# Patient Record
Sex: Female | Born: 1965 | Race: White | Hispanic: No | Marital: Married | Smoking: Former smoker
Health system: Southern US, Community
[De-identification: ages and names within clinical notes are randomized; demographics above are authoritative.]

## PROBLEM LIST (undated history)

## (undated) DIAGNOSIS — N393 Stress incontinence (female) (male): Secondary | ICD-10-CM

## (undated) DIAGNOSIS — Z87442 Personal history of urinary calculi: Secondary | ICD-10-CM

---

## 1998-07-01 ENCOUNTER — Other Ambulatory Visit: Admission: RE | Admit: 1998-07-01 | Discharge: 1998-07-01 | Payer: Self-pay | Admitting: Obstetrics and Gynecology

## 1999-01-28 ENCOUNTER — Ambulatory Visit (HOSPITAL_COMMUNITY): Admission: RE | Admit: 1999-01-28 | Discharge: 1999-01-28 | Payer: Self-pay | Admitting: Plastic Surgery

## 1999-01-28 ENCOUNTER — Encounter (INDEPENDENT_AMBULATORY_CARE_PROVIDER_SITE_OTHER): Payer: Self-pay | Admitting: Specialist

## 1999-03-24 ENCOUNTER — Inpatient Hospital Stay (HOSPITAL_COMMUNITY): Admission: EM | Admit: 1999-03-24 | Discharge: 1999-03-26 | Payer: Self-pay | Admitting: Emergency Medicine

## 1999-09-08 ENCOUNTER — Other Ambulatory Visit: Admission: RE | Admit: 1999-09-08 | Discharge: 1999-09-08 | Payer: Self-pay | Admitting: *Deleted

## 2001-07-31 ENCOUNTER — Ambulatory Visit (HOSPITAL_COMMUNITY): Admission: RE | Admit: 2001-07-31 | Discharge: 2001-07-31 | Payer: Self-pay | Admitting: Family Medicine

## 2001-07-31 ENCOUNTER — Encounter: Payer: Self-pay | Admitting: Family Medicine

## 2002-10-02 ENCOUNTER — Emergency Department (HOSPITAL_COMMUNITY): Admission: EM | Admit: 2002-10-02 | Discharge: 2002-10-02 | Payer: Self-pay | Admitting: Emergency Medicine

## 2003-08-10 HISTORY — PX: NASAL SEPTUM SURGERY: SHX37

## 2003-10-08 ENCOUNTER — Encounter: Admission: RE | Admit: 2003-10-08 | Discharge: 2003-10-08 | Payer: Self-pay | Admitting: Family Medicine

## 2003-10-14 ENCOUNTER — Encounter: Admission: RE | Admit: 2003-10-14 | Discharge: 2003-10-14 | Payer: Self-pay | Admitting: Family Medicine

## 2003-11-07 ENCOUNTER — Ambulatory Visit (HOSPITAL_COMMUNITY): Admission: RE | Admit: 2003-11-07 | Discharge: 2003-11-07 | Payer: Self-pay | Admitting: Family Medicine

## 2004-08-09 HISTORY — PX: BREAST ENHANCEMENT SURGERY: SHX7

## 2005-04-20 ENCOUNTER — Observation Stay (HOSPITAL_COMMUNITY): Admission: AD | Admit: 2005-04-20 | Discharge: 2005-04-21 | Payer: Self-pay | Admitting: *Deleted

## 2006-09-06 IMAGING — CT CT PELVIS W/ CM
1 of 3 series · 14 of 32 positions shown, 19 images · IV contrast (CONTRAST)
Comparison: none

CLINICAL DATA: Right lower abdominal and pelvic pain.  Nausea and vomiting.  Question of appendicitis.
 ABDOMEN CT WITH CONTRAST:
TECHNIQUE: Multidetector CT imaging of the abdomen was performed following the standard protocol during bolus administration of intravenous contrast.
 Contrast:  100 cc Omnipaque 300 and oral contrast.
TECHNIQUE: Multidetector CT imaging of the pelvis was performed following the standard protocol during bolus administration of intravenous contrast.

[Series 9574: — · axial · 0.74mm/px · z∈[+1329,+1679]mm · 14 of 80 slices shown, 19 images]
[im 5/80  soft-tissue]
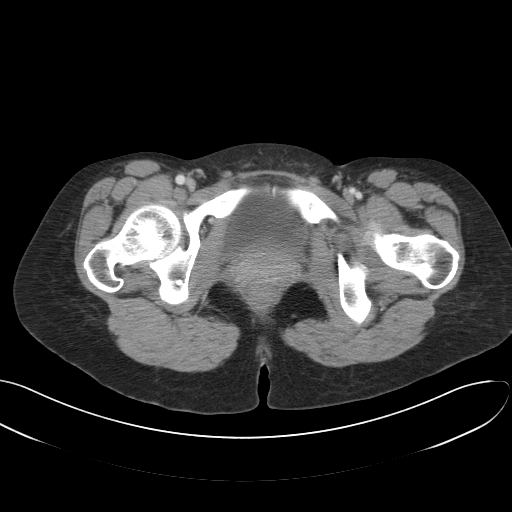
[im 5/80  bone]
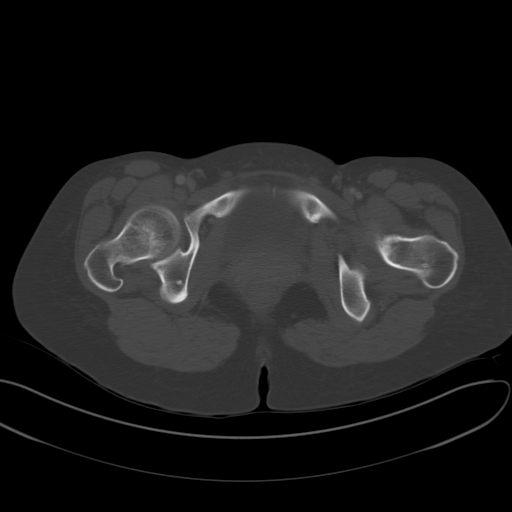
[im 10/80  soft-tissue]
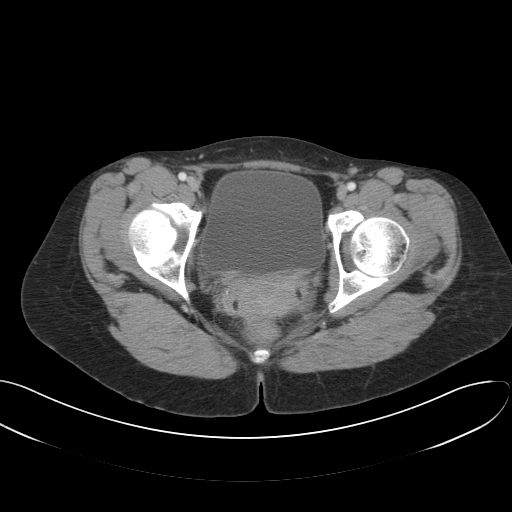
[im 19/80  soft-tissue]
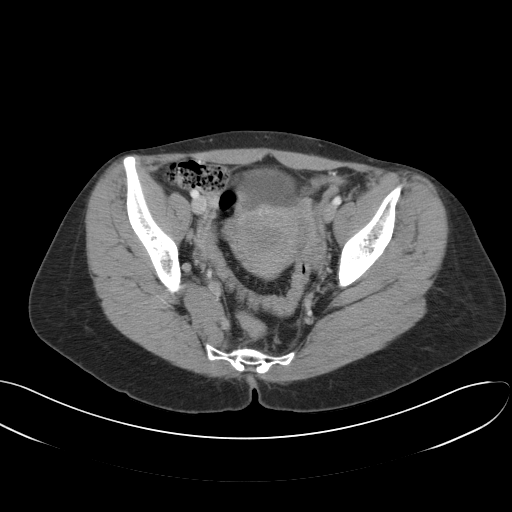
[im 24/80  soft-tissue]
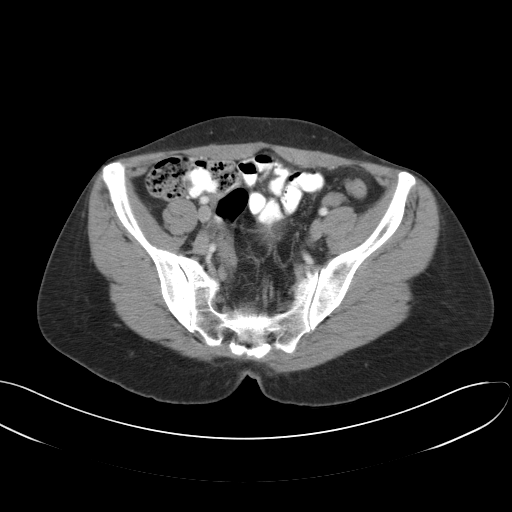
[im 28/80  soft-tissue]
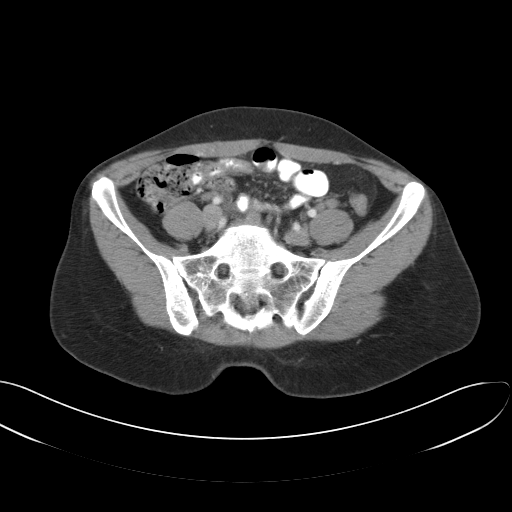
[im 33/80  soft-tissue]
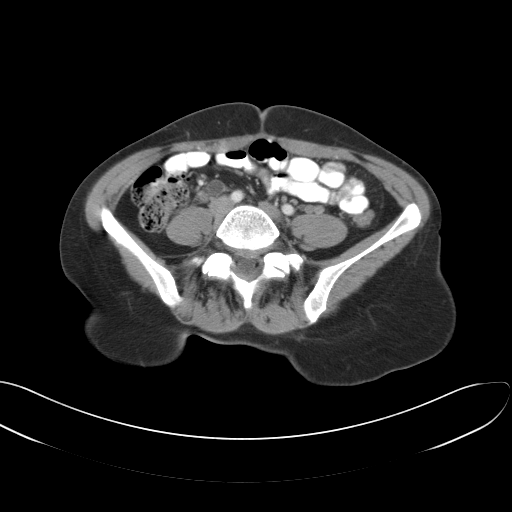
[im 42/80  soft-tissue]
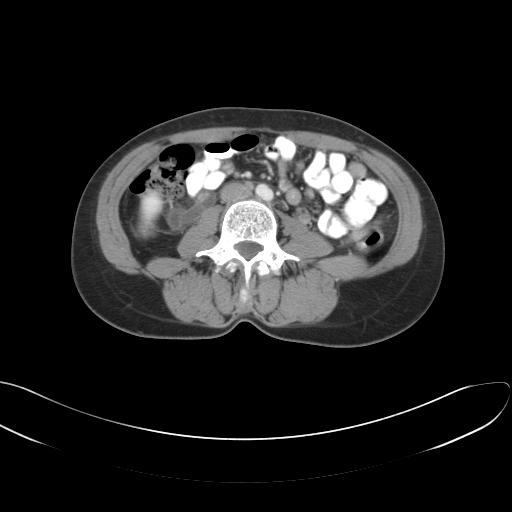
[im 47/80  soft-tissue]
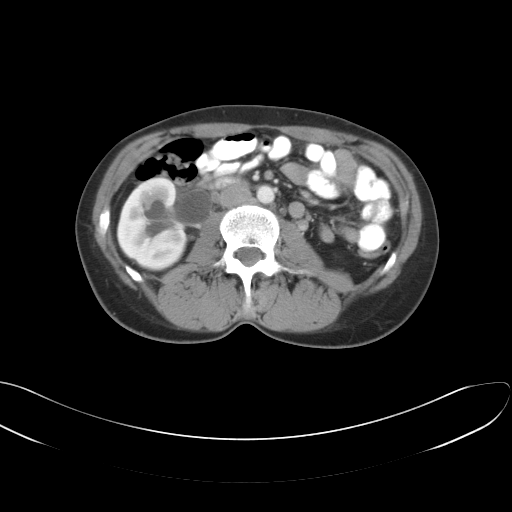
[im 52/80  soft-tissue]
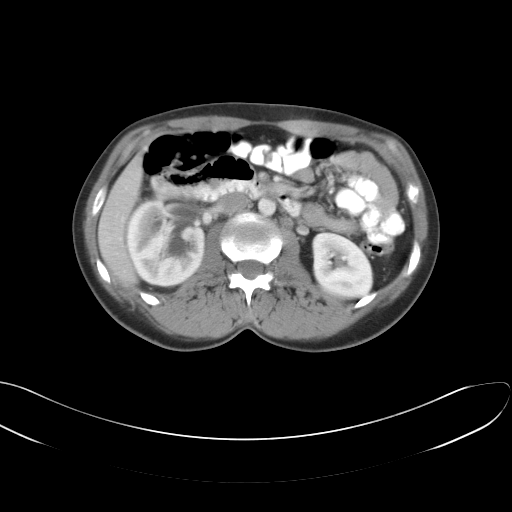
[im 52/80  bone]
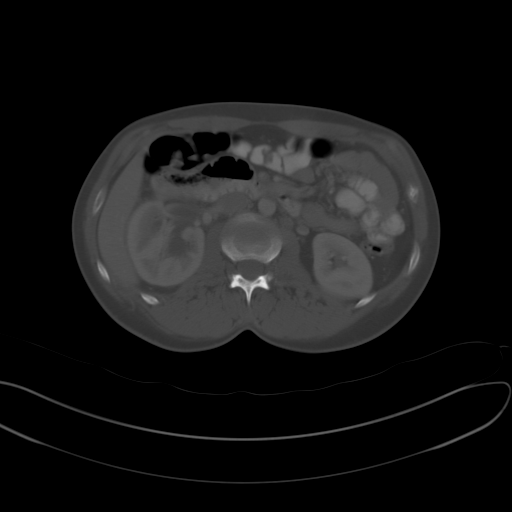
[im 56/80  soft-tissue]
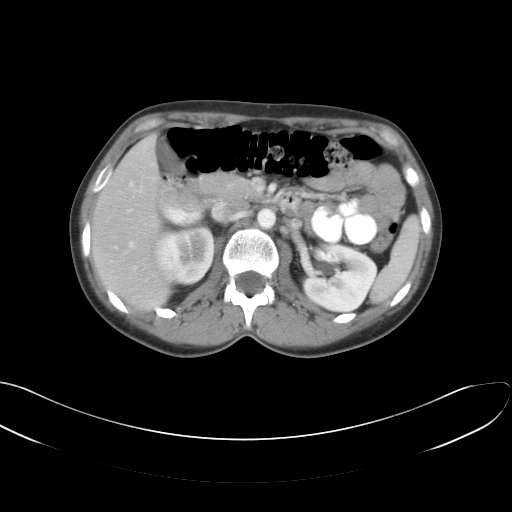
[im 61/80  soft-tissue]
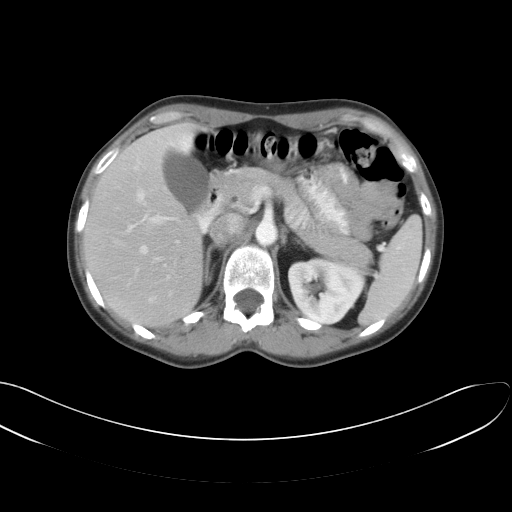
[im 61/80  lung]
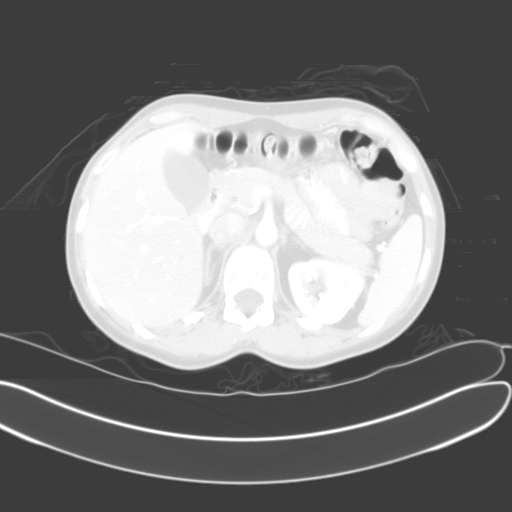
[im 66/80  lung]
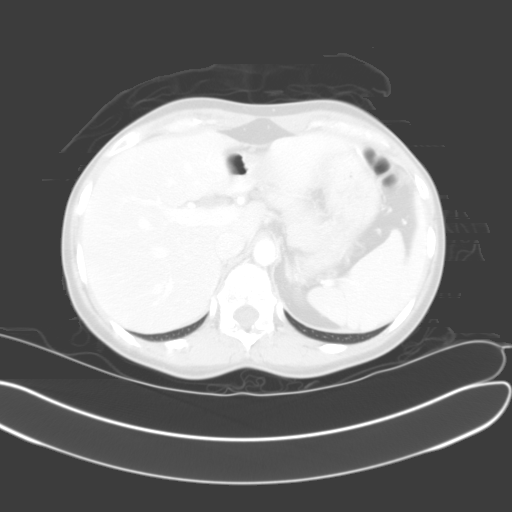
[im 70/80  soft-tissue]
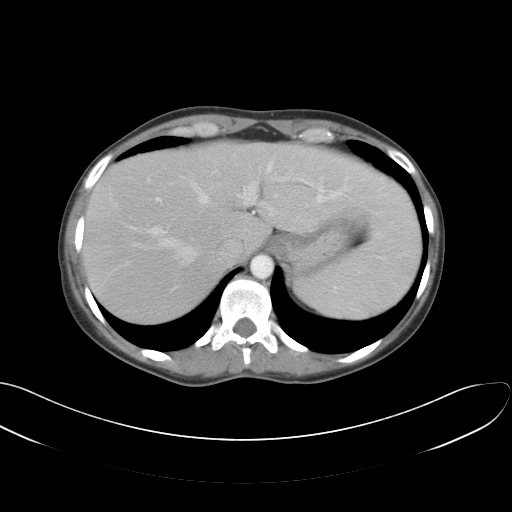
[im 70/80  lung]
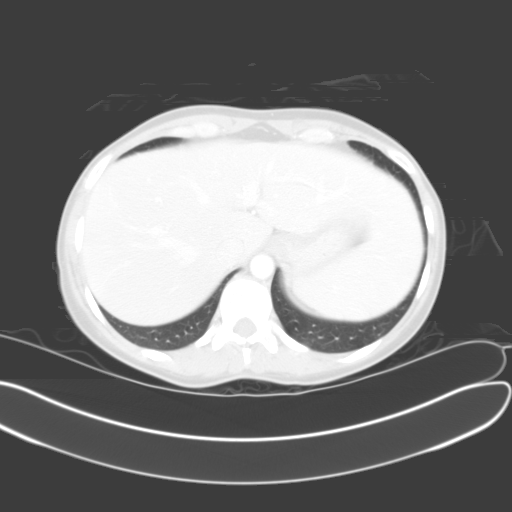
[im 75/80  soft-tissue]
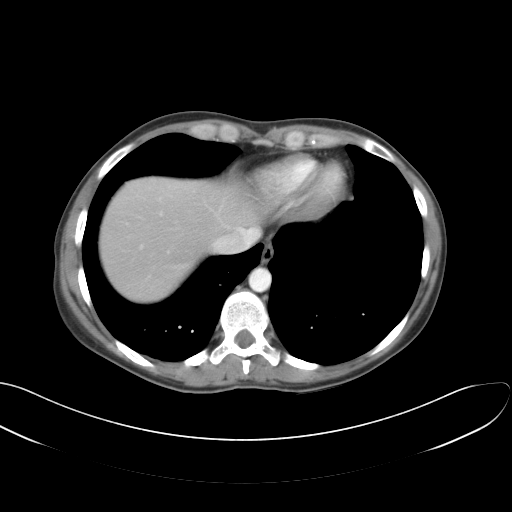
[im 75/80  lung]
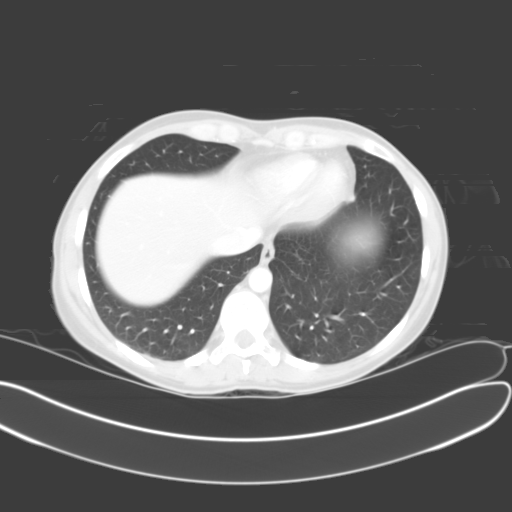

[14 of 32 positions shown; findings below may reference images not displayed]

FINDINGS: Moderate right hydronephrosis and ureterectasis is seen.  The left kidney is normal in appearance.  The other abdominal parenchymal organs are also unremarkable.  
 There is no evidence of abdominal mass or adenopathy.  There is no evidence of inflammatory process.
IMPRESSION: Moderate right hydronephrosis.  See pelvis CT report below.
 PELVIS CT WITH CONTRAST:
FINDINGS: A calculus is seen at the right ureterovesical junction which measures approximately 4 mm.  Moderate right ureterectasis is seen.  There is no evidence of pelvic mass or inflammatory process.  No abnormal fluid collections are seen.  Incidental note is made of a pessary.
IMPRESSION: 4 mm calculus at the right ureterovesical junction.  This results in moderate right hydronephrosis and ureterectasis.

## 2008-06-11 ENCOUNTER — Other Ambulatory Visit: Admission: RE | Admit: 2008-06-11 | Discharge: 2008-06-11 | Payer: Self-pay | Admitting: Unknown Physician Specialty

## 2008-06-11 ENCOUNTER — Encounter (INDEPENDENT_AMBULATORY_CARE_PROVIDER_SITE_OTHER): Payer: Self-pay | Admitting: Unknown Physician Specialty

## 2010-12-25 NOTE — Discharge Summary (Signed)
NAME:  Rita Vazquez, Rita Vazquez                ACCOUNT NO.:  [id number]   MEDICAL RECORD NO.:  [id number]          PATIENT TYPE:  OBV   LOCATION:  A417                          FACILITY:  [facility]   PHYSICIAN:  Langley Gauss, MD     DATE OF BIRTH:  04-03-1966   DATE OF ADMISSION:  04/20/2005  DATE OF DISCHARGE:  LH                                 DISCHARGE SUMMARY   DIAGNOSES:  1.  Right lower quadrant pain.  2.  Hemorrhagic ovarian cyst.  3.  Renal calculi.  4.  Nausea.   DISPOSITION:  The patient is to follow up in the office in one week's time.   DISCHARGE MEDICATIONS:  The patient was given a prescription for Lortab  during her prenatal office visit.  On today's date she was given Macrodantin  100 mg p.o. b.i.d. x7 days in effort to prevent superinfection.  She also  received 1 g of IV Rocephin during this hospital stay.   OTHER MEDICATIONS ADMINISTERED:  The patient initially was treated with IV  morphine and IV Phenergan.  She had a paradoxical reaction to the morphine  with chest pain and palpitations, subsequently was treated with 5 mg of IV  Valium with complete resolution of her symptoms.   RADIOLOGICAL STUDIES PERFORMED:  A CT with contrast was performed the late  p.m. of initial observation.  Actual time recorded for interpretation of the  CT 04/21/05 at 12:39 a.m., noted was a 4 mm stone at the right  ureterovesicular  junction with moderate right hydronephrosis.  Ovaries were  visualized with follicles identified bilaterally.   OTHER LABORATORY STUDIES:  A clean catch UA neg for leukocyte esterase,  negative for nitrites, negative for ketones, 3-6 red cells, few epithelial  cells.  Serum hCG is negative, hemoglobin 12.8, hematocrit 36.6, white count  16.8.   HOSPITAL COURSE:  See previous dictation.  The patient was seen in the  office 04/20/05 with complaints of right lower quadrant right-sided pain.  Ultrasound did reveal the presence of a hemorrhagic ovarian cyst on the  right.  The patient was sent home from the office with p.o. Lortab  prescription.  However, she represented at [facility] late p.m. of 04/20/05  complaining of increased nausea as well as increased pain.  With diagnoses  uncertain, CT of the abdomen and pelvis with contrast was performed which  did reveal the presence of the 4 mm stone at the right ureterovesical  junction.  Subsequently this has been managed expectantly, though the  patient has not identified passage of a stone.  She has had near complete  resolution and cessation of her colicky pain.  She has been up ambulatory,  has only received pain medications x1 today.  Thus she is discharged to home  at this time.  A urological  consultation would have been obtained only if pain had persisted or if the  stone was noted to be larger.  Thus the patient is discharged to home from  observation services 04/21/05.  Follow up in the office in one week's time or  as clinically indicated.  Langley Gauss, MD  Electronically Signed     DC/MEDQ  D:  04/21/2005  T:  04/22/2005  Job:  [id number]

## 2010-12-25 NOTE — H&P (Signed)
NAME:  Rita Vazquez, Rita Vazquez                ACCOUNT NO.:  [id number]   MEDICAL RECORD NO.:  [id number]          PATIENT TYPE:  OBV   LOCATION:  A417                          FACILITY:  [facility]   PHYSICIAN:  Langley Gauss, MD     DATE OF BIRTH:  1966-05-23   DATE OF ADMISSION:  04/20/2005  DATE OF DISCHARGE:  LH                                HISTORY & PHYSICAL   Patient is a white female, gravida 2, para 2, two prior vaginal deliveries.  She is currently utilizing a NuvaRing for birth control purposes.  Patient  had a last menstrual period that started 10 days ago.  This was a normal  period, other than being somewhat lighter and shorter than usual.  The  patient was placed on observation status.  Complains of right lower quadrant  pain.  The history is that yesterday a.m., the patient had some fairly  severe sharp right lower quadrant pain, which caused her to curl up in the  fetal position.  She was scheduled to be off work.  Thus, by restricting her  activity, she was able to limit the amount of pain she experienced.  She  took Tums and p.o. Tylenol, which she stated gave her fairly significant  pain relief.  Then, the patient slept poorly throughout the night.  Today,  the right lower quadrant pain has moved up higher in the right side of the  lower abdomen and is noted to radiate around to the patient's right side.  She states that the pain is improved by lying on that side and limiting her  movement.  She was seen for evaluation today in the office, at which time a  transvaginal and transabdominal ultrasound were performed.  She was noted to  have a 2.8 cm hemorrhagic cyst on the right as well as multiple smaller  follicles, making the right ovary about 7 cm in maximum diameter.  Appeared  to be some fluid adjacent to the right ovary and appeared to be viscus in  nature.  No other free fluid was identified within the pelvis.  Uterus was  noted to be normal in size, shape, and configuration  and anteflexed.  Left  ovary was noted to be atrophic with only a single follicle.  Patient's  review of systems was pertinent for good appetite today.  The patient had  eaten prior to the office visit.  She had a bowel movement today.  She  frequently has 2-3 bowel movements per day and has noted no change in her  bowel habits.  She denies any diarrhea.  She denies any fever or chills.  She denies any blood in the urine or any urinary tract infection type of  symptoms.   Patient states that she previously had an allergic reaction to [facility]  FORTIS.   CURRENT MEDICATIONS:  Include Tylenol and the NuvaRing for birth control  purposes.  I did prescribe her Lortab 10/500 today.  She took one of these  and stated this gave her no relief.   She has a prior vaginal deliveries of an 43 year old and  now an 45-year-old.  The second delivery was complicated by post-partum hemorrhage with retained  placenta, requiring a D&C.  No transfusion is indicated.  The patient has  bilateral breast implants.  She has also had tonsils removed at about age  45.   PHYSICAL EXAMINATION:  VITAL SIGNS:  Temperature 97.9.  GENERAL:  The patient is seen in the office today.  She did have increased  pain post examination.  Upon presentation to the hospital with increased  pain this p.m., she is noted to be pale in appearance and complaining of  increased pain and now significant nausea.  HEENT:  Negative.  NECK:  Supple.  No palpable adenopathy.  LUNGS:  Clear.  CARDIOVASCULAR:  Regular rate and rhythm.  ABDOMEN:  Soft.  There is noted to be some tenderness to palpation in the  right lower quadrant but more marked tenderness in the right mid quadrant  above wherever we would expect the ovary to be.  Manipulation of the uterus  is now mildly tender only.  The left adnexa is palpably normal and  nontender.   LABORATORY STUDIES:  Currently pending, consisting of an ABG, CBC with diff,  BUN and creatinine.    ASSESSMENT/PLAN:  Patient with previous diagnosis and impression of  hemorrhagic right ovarian cyst; however, now rather than the pain  decreasing, as it had over the past 24 hours, the patient has had an  increased intensity of the pain as well as onset of significant nausea.  The  nausea was noted to improve after vomiting.  The differential diagnosis  includes possible appendicitis.  This p.m., the patient will be treated with  IV analgesics as well as Zofran for nausea.  Subsequently, CT with contrast  has been requested.  The patient's mother, Theone Stanley, requests that Dr. Katrinka Blazing  be consulted if non-GYN process is identified.      Langley Gauss, MD  Electronically Signed     DC/MEDQ  D:  04/20/2005  T:  04/20/2005  Job:  [id number]

## 2011-01-22 ENCOUNTER — Other Ambulatory Visit: Payer: Self-pay | Admitting: Family Medicine

## 2011-01-22 DIAGNOSIS — Z1231 Encounter for screening mammogram for malignant neoplasm of breast: Secondary | ICD-10-CM

## 2011-02-02 ENCOUNTER — Ambulatory Visit
Admission: RE | Admit: 2011-02-02 | Discharge: 2011-02-02 | Disposition: A | Discharge status: Home / Self Care | Payer: BC Managed Care – PPO | Source: Ambulatory Visit | Attending: Family Medicine | Admitting: Family Medicine

## 2011-02-02 DIAGNOSIS — Z1231 Encounter for screening mammogram for malignant neoplasm of breast: Secondary | ICD-10-CM

## 2011-02-04 ENCOUNTER — Other Ambulatory Visit: Payer: Self-pay | Admitting: Family Medicine

## 2011-02-04 DIAGNOSIS — R928 Other abnormal and inconclusive findings on diagnostic imaging of breast: Secondary | ICD-10-CM

## 2011-02-05 ENCOUNTER — Ambulatory Visit
Admission: RE | Admit: 2011-02-05 | Discharge: 2011-02-05 | Disposition: A | Discharge status: Home / Self Care | Payer: BC Managed Care – PPO | Source: Ambulatory Visit | Attending: Family Medicine | Admitting: Family Medicine

## 2011-02-05 DIAGNOSIS — R928 Other abnormal and inconclusive findings on diagnostic imaging of breast: Secondary | ICD-10-CM

## 2011-02-09 ENCOUNTER — Other Ambulatory Visit: Payer: BC Managed Care – PPO

## 2011-09-29 ENCOUNTER — Encounter (HOSPITAL_COMMUNITY): Payer: Self-pay | Admitting: Psychology

## 2011-09-29 ENCOUNTER — Ambulatory Visit (INDEPENDENT_AMBULATORY_CARE_PROVIDER_SITE_OTHER): Payer: BC Managed Care – PPO | Admitting: Psychology

## 2011-09-29 DIAGNOSIS — F4322 Adjustment disorder with anxiety: Secondary | ICD-10-CM

## 2011-09-29 NOTE — Progress Notes (Signed)
Patient:   Rita D Kimes   DOB:   March 12, 1966  MR Number:  [id number]  Location:  [facility] [address] [place] [address] Dept: [phone]           Date of Service:   09/29/2011  Start Time:   1 PM End Time:   2 PM  Provider/Observer:  Hershal Coria PSYD       Billing Code/Service: [phone]  Chief Complaint:     Chief Complaint  Patient presents with  . Family Problem  . Anxiety    Reason for Service:  The patient was self-referred because of concerns and issues she is having with her stepchildren. The patient was married in September, which is her second marriage. Her new husband has children from his previous marriage as well and the integration of this marital unit has had some difficulties. The youngest son of her husband lives with them and has significant interpersonal limitations and has displayed symptoms that sound similar to Asperger's as well as showing some symptoms of Tourette's. The patient is bit uncomfortable with some of the things that happened has not known how to address these things her adjust to them. This is causing stress for the patient and she is trying to figure out how to handle them so they don't magnifying marital distress.  Current Status:  The patient has had difficulty adjusting to some of the traits and behaviors that her stepson and engages in including regularly climbing into her bed to lay with his father in the evenings. This stepson is 31 years old and has not left the house and does not take care of his daily cleanliness and has not been able to have a job outside of helping his father and his father's business. These daughter sees her creating a lot of stress and the patient is really not known how to handle them are dressed them.  Reliability of Information: The patient provided all this information or self.  Behavioral Observation: Rita Vazquez   presents as a 46 y.o.-year-old Right Caucasian Female who appeared her stated age. her dress was Appropriate and she was Well Groomed and her manners were Appropriate to the situation.  There were not any physical disabilities noted.  she displayed an appropriate level of cooperation and motivation.    Interactions:    Active   Attention:   within normal limits  Memory:   within normal limits  Visuo-spatial:   within normal limits  Speech (Volume):  normal  Speech:   normal pitch and normal volume  Thought Process:  Coherent  Though Content:  WNL  Orientation:   person, place, time/date, situation, day of week and month of year  Judgment:   Good  Planning:   Good  Affect:    Appropriate  Mood:    Anxious  Insight:   Good  Intelligence:   high  Marital Status/Living: The patient has recently entered into her second marriage. She currently lives with her husband, her son, and her stepson. This is her third marriage. She has a 48 year old daughter and a 53 year old son and a 3 year old son continues to live with them. She was born in [place] and grew up in [place] the marriage is going well and with the exception of trying to figure out how the relationship between her husband and his son has developed and continues there are no other big issues that are  there have been in their marriage.  Current Employment: The patient is currently working and has been employed for the past 1-1/2 years at a [employer], clinical. 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    Education:   Graduate  Medical History:  History reviewed. No pertinent past medical history.      No outpatient encounter prescriptions on file as of 09/29/2011.          Sexual History:   History  Sexual  Activity  . Sexually Active: Yes    Abuse/Trauma History: No history of abuse or trauma  Psychiatric History:  No history of psychiatric issues her treatment  Family Med/Psych History:  Family History  Problem Relation Age of Onset  . Depression Brother     Risk of Suicide/Violence: virtually non-existent   Impression/DX:  At this point, there do appear to be some specific stressors that are developed with her new marriage. She was married this past September and the relationship an interaction between she and her husband is going quite well. The patient's husband has 3 children, all of whom have their own issues including some significant social and interpersonal issues. The youngest son continues to live with them and by her description display symptoms that are consistent with Asperger's as well as symptoms consistent with Tourette's syndrome. However, her husband does not really addressed these and continues to get in situations where the kids essentially manipulate him into doing whatever they want. Some of the behaviors that her stepson is doing caused her great concern and feelings of being uncomfortable. One big one is the fact that her stepson will constantly crawl into her bed when she is not day or to lay with his father. This makes her feel very uncomfortable and not sure about the dynamics of this and the fact that the stepson is 44 years old really does not no other handle this.  Disposition/Plan:  We will begin working on psychotherapeutic interventions to help the patient adjust to these difficulties and adjustment problems around her current life situation and anxiety associated with it.  Diagnosis:    Axis I:   1. Adjustment disorder with anxious mood         Axis II: No diagnosis       Axis III:  No significant medical issues other than those that can be found in her chart       Axis IV:  other psychosocial or environmental problems          Axis V:  51-60 moderate  symptoms

## 2011-12-07 ENCOUNTER — Other Ambulatory Visit: Payer: Self-pay | Admitting: Dermatology

## 2012-07-21 ENCOUNTER — Other Ambulatory Visit: Payer: Self-pay

## 2012-07-24 ENCOUNTER — Encounter (HOSPITAL_BASED_OUTPATIENT_CLINIC_OR_DEPARTMENT_OTHER): Payer: Self-pay | Admitting: *Deleted

## 2012-07-24 NOTE — Progress Notes (Signed)
NPO AFTER MN WITH EXCEPTION CLEAR LIQUIDS UNTIL 0830 (NO CREAM/ MILK PRODUCTS). ARRIVES  AT 1230. PRE-OP ORDERS PENDING. NEEDS HG AND URINE PREG.

## 2012-07-25 ENCOUNTER — Other Ambulatory Visit: Payer: Self-pay | Admitting: Obstetrics and Gynecology

## 2012-07-27 ENCOUNTER — Encounter (HOSPITAL_BASED_OUTPATIENT_CLINIC_OR_DEPARTMENT_OTHER): Payer: Self-pay | Admitting: *Deleted

## 2012-07-27 ENCOUNTER — Ambulatory Visit (HOSPITAL_BASED_OUTPATIENT_CLINIC_OR_DEPARTMENT_OTHER)
Admission: RE | Admit: 2012-07-27 | Discharge: 2012-07-27 | Disposition: A | Discharge status: Home / Self Care | Payer: BC Managed Care – PPO | Source: Ambulatory Visit | Attending: Obstetrics and Gynecology | Admitting: Obstetrics and Gynecology

## 2012-07-27 ENCOUNTER — Ambulatory Visit (HOSPITAL_BASED_OUTPATIENT_CLINIC_OR_DEPARTMENT_OTHER): Payer: BC Managed Care – PPO | Admitting: Anesthesiology

## 2012-07-27 ENCOUNTER — Encounter (HOSPITAL_BASED_OUTPATIENT_CLINIC_OR_DEPARTMENT_OTHER)
Admission: RE | Disposition: A | Discharge status: Home / Self Care | Payer: Self-pay | Source: Ambulatory Visit | Attending: Obstetrics and Gynecology

## 2012-07-27 ENCOUNTER — Encounter (HOSPITAL_BASED_OUTPATIENT_CLINIC_OR_DEPARTMENT_OTHER): Payer: Self-pay | Admitting: Anesthesiology

## 2012-07-27 DIAGNOSIS — IMO0002 Reserved for concepts with insufficient information to code with codable children: Secondary | ICD-10-CM | POA: Insufficient documentation

## 2012-07-27 DIAGNOSIS — N9089 Other specified noninflammatory disorders of vulva and perineum: Secondary | ICD-10-CM | POA: Insufficient documentation

## 2012-07-27 DIAGNOSIS — R32 Unspecified urinary incontinence: Secondary | ICD-10-CM

## 2012-07-27 DIAGNOSIS — N816 Rectocele: Secondary | ICD-10-CM | POA: Insufficient documentation

## 2012-07-27 DIAGNOSIS — N8111 Cystocele, midline: Secondary | ICD-10-CM | POA: Insufficient documentation

## 2012-07-27 HISTORY — DX: Stress incontinence (female) (male): N39.3

## 2012-07-27 HISTORY — PX: LABIOPLASTY: SHX1900

## 2012-07-27 HISTORY — PX: CYSTOCELE REPAIR: SHX163

## 2012-07-27 HISTORY — DX: Personal history of urinary calculi: Z87.442

## 2012-07-27 LAB — CBC
HCT: 36.7 % (ref 36.0–46.0)
MCH: 31.4 pg (ref 26.0–34.0)
MCV: 88.6 fL (ref 78.0–100.0)
RBC: 4.14 MIL/uL (ref 3.87–5.11)
WBC: 5.2 10*3/uL (ref 4.0–10.5)

## 2012-07-27 LAB — HCG, SERUM, QUALITATIVE: Preg, Serum: NEGATIVE

## 2012-07-27 SURGERY — LABIAPLASTY, VULVA
Anesthesia: General | Site: Vagina | Wound class: Clean Contaminated

## 2012-07-27 MED ORDER — PROMETHAZINE HCL 25 MG/ML IJ SOLN
6.2500 mg | INTRAMUSCULAR | Status: DC | PRN
  Filled 2012-07-27: qty 1

## 2012-07-27 MED ORDER — CEFAZOLIN SODIUM-DEXTROSE 2-3 GM-% IV SOLR
2.0000 g | INTRAVENOUS | Status: DC
  Filled 2012-07-27: qty 50

## 2012-07-27 MED ORDER — FENTANYL CITRATE 0.05 MG/ML IJ SOLN
INTRAMUSCULAR | Status: DC | PRN
  Administered 2012-07-27 (×2): 25 ug via INTRAVENOUS
  Administered 2012-07-27: 50 ug via INTRAVENOUS
  Administered 2012-07-27 (×6): 25 ug via INTRAVENOUS
  Administered 2012-07-27: 50 ug via INTRAVENOUS

## 2012-07-27 MED ORDER — KETOROLAC TROMETHAMINE 30 MG/ML IJ SOLN
15.0000 mg | Freq: Once | INTRAMUSCULAR | Status: DC | PRN
  Filled 2012-07-27: qty 1

## 2012-07-27 MED ORDER — LIDOCAINE HCL (CARDIAC) 20 MG/ML IV SOLN
INTRAVENOUS | Status: DC | PRN
  Administered 2012-07-27: 75 mg via INTRAVENOUS

## 2012-07-27 MED ORDER — LACTATED RINGERS IV SOLN
INTRAVENOUS | Status: DC | PRN
  Administered 2012-07-27 (×2): via INTRAVENOUS

## 2012-07-27 MED ORDER — KETOROLAC TROMETHAMINE 30 MG/ML IJ SOLN
INTRAMUSCULAR | Status: DC | PRN
  Administered 2012-07-27: 30 mg via INTRAVENOUS

## 2012-07-27 MED ORDER — MIDAZOLAM HCL 5 MG/5ML IJ SOLN
INTRAMUSCULAR | Status: DC | PRN
  Administered 2012-07-27 (×2): 1 mg via INTRAVENOUS

## 2012-07-27 MED ORDER — DEXAMETHASONE SODIUM PHOSPHATE 4 MG/ML IJ SOLN
INTRAMUSCULAR | Status: DC | PRN
  Administered 2012-07-27: 10 mg via INTRAVENOUS

## 2012-07-27 MED ORDER — FENTANYL CITRATE 0.05 MG/ML IJ SOLN
25.0000 ug | INTRAMUSCULAR | Status: DC | PRN
  Filled 2012-07-27: qty 1

## 2012-07-27 MED ORDER — PROPOFOL 10 MG/ML IV BOLUS
INTRAVENOUS | Status: DC | PRN
  Administered 2012-07-27: 200 mg via INTRAVENOUS

## 2012-07-27 MED ORDER — LACTATED RINGERS IV SOLN
INTRAVENOUS | Status: DC
  Administered 2012-07-27: 14:00:00 via INTRAVENOUS
  Filled 2012-07-27: qty 1000

## 2012-07-27 MED ORDER — OXYCODONE-ACETAMINOPHEN 5-325 MG PO TABS: 1.0000 | ORAL_TABLET | ORAL | Status: AC | PRN

## 2012-07-27 MED ORDER — ONDANSETRON HCL 4 MG/2ML IJ SOLN
INTRAMUSCULAR | Status: DC | PRN
  Administered 2012-07-27: 4 mg via INTRAVENOUS

## 2012-07-27 MED ORDER — LIDOCAINE-EPINEPHRINE (PF) 1 %-1:200000 IJ SOLN: INTRAMUSCULAR | Status: DC | PRN

## 2012-07-27 MED ORDER — BUPIVACAINE-EPINEPHRINE 0.25% -1:200000 IJ SOLN
INTRAMUSCULAR | Status: DC | PRN
  Administered 2012-07-27: 12 mL

## 2012-07-27 MED ORDER — TRAMADOL HCL 50 MG PO TABS: 50.0000 mg | ORAL_TABLET | Freq: Four times a day (QID) | ORAL | Status: AC | PRN

## 2012-07-27 SURGICAL SUPPLY — 51 items
BAG URINE DRAINAGE (UROLOGICAL SUPPLIES) ×2 IMPLANT
BAG URINE LEG 19OZ MD ST LTX (BAG) ×2 IMPLANT
BLADE SURG 15 STRL LF DISP TIS (BLADE) ×1 IMPLANT
BLADE SURG 15 STRL SS (BLADE) ×1
CATH FOLEY 2WAY SLVR  5CC 14FR (CATHETERS) ×1
CATH FOLEY 2WAY SLVR 5CC 14FR (CATHETERS) ×1 IMPLANT
CATH ROBINSON RED A/P 16FR (CATHETERS) IMPLANT
CLOTH BEACON ORANGE TIMEOUT ST (SAFETY) ×2 IMPLANT
COVER TABLE BACK 60X90 (DRAPES) ×2 IMPLANT
DECANTER SPIKE VIAL GLASS SM (MISCELLANEOUS) IMPLANT
DRAPE LG THREE QUARTER DISP (DRAPES) ×4 IMPLANT
DRAPE UNDERBUTTOCKS STRL (DRAPE) ×2 IMPLANT
ELECT NEEDLE TIP 2.8 STRL (NEEDLE) IMPLANT
ELECT REM PT RETURN 9FT ADLT (ELECTROSURGICAL) ×2
ELECTRODE REM PT RTRN 9FT ADLT (ELECTROSURGICAL) ×1 IMPLANT
GLOVE BIO SURGEON STRL SZ7.5 (GLOVE) ×4 IMPLANT
GLOVE BIOGEL PI IND STRL 6.5 (GLOVE) ×2 IMPLANT
GLOVE BIOGEL PI IND STRL 7.0 (GLOVE) ×1 IMPLANT
GLOVE BIOGEL PI INDICATOR 6.5 (GLOVE) ×2
GLOVE BIOGEL PI INDICATOR 7.0 (GLOVE) ×1
GLOVE SKINSENSE NS SZ7.0 (GLOVE) ×1
GLOVE SKINSENSE NS SZ7.5 (GLOVE) ×2
GLOVE SKINSENSE STRL SZ7.0 (GLOVE) ×1 IMPLANT
GLOVE SKINSENSE STRL SZ7.5 (GLOVE) ×2 IMPLANT
GOWN PREVENTION PLUS XLARGE (GOWN DISPOSABLE) IMPLANT
GOWN STRL NON-REIN LRG LVL3 (GOWN DISPOSABLE) ×2 IMPLANT
GOWN STRL REIN XL XLG (GOWN DISPOSABLE) ×2 IMPLANT
LEGGING LITHOTOMY PAIR STRL (DRAPES) ×2 IMPLANT
NEEDLE HYPO 22GX1.5 SAFETY (NEEDLE) IMPLANT
NEEDLE SPNL 22GX3.5 QUINCKE BK (NEEDLE) IMPLANT
NS IRRIG 1000ML POUR BTL (IV SOLUTION) ×2 IMPLANT
NS IRRIG 500ML POUR BTL (IV SOLUTION) ×2 IMPLANT
PACK BASIN DAY SURGERY FS (CUSTOM PROCEDURE TRAY) ×2 IMPLANT
PAD OB MATERNITY 4.3X12.25 (PERSONAL CARE ITEMS) ×2 IMPLANT
PAD PREP 24X48 CUFFED NSTRL (MISCELLANEOUS) ×2 IMPLANT
PENCIL BUTTON BLDE SNGL 10FT (ELECTRODE) ×2 IMPLANT
SPONGE LAP 4X18 X RAY DECT (DISPOSABLE) ×2 IMPLANT
SUT MON AB 2-0 CT2 27 (SUTURE) ×4 IMPLANT
SUT MON AB-0 CT1 36 (SUTURE) ×2 IMPLANT
SUT VICRYL 3 0 CT 3 (SUTURE) IMPLANT
SYR CONTROL 10ML LL (SYRINGE) ×2 IMPLANT
SYRINGE 10CC LL (SYRINGE) ×2 IMPLANT
TOWEL NATURAL 6PK STERILE (DISPOSABLE) ×4 IMPLANT
TOWEL OR 17X24 6PK STRL BLUE (TOWEL DISPOSABLE) ×4 IMPLANT
TRAY DSU PREP LF (CUSTOM PROCEDURE TRAY) ×2 IMPLANT
TUBE CONNECTING 12X1/4 (SUCTIONS) ×2 IMPLANT
VICRYL RAPIDE ×4 IMPLANT
VICRYL RAPIDE 3-0 ×12 IMPLANT
WATER STERILE IRR 1000ML POUR (IV SOLUTION) ×2 IMPLANT
WATER STERILE IRR 500ML POUR (IV SOLUTION) ×2 IMPLANT
YANKAUER SUCT BULB TIP NO VENT (SUCTIONS) IMPLANT

## 2012-07-27 NOTE — Progress Notes (Signed)
Patient ID: Rita Vazquez, female   DOB: 04-03-1966, 46 y.o.   MRN: [id number] Patient seen and examined. Consent witnessed and signed. No changes noted. Update completed.

## 2012-07-27 NOTE — Anesthesia Preprocedure Evaluation (Addendum)
Anesthesia Evaluation  Patient identified by MRN, date of birth, ID band Patient awake    Reviewed: Allergy & Precautions, H&P , NPO status , Patient's Chart, lab work & pertinent test results  Airway Mallampati: II TM Distance: >3 FB Neck ROM: Full    Dental No notable dental hx. (+) Dental Advisory Given and Teeth Intact   Pulmonary neg pulmonary ROS,  breath sounds clear to auscultation  Pulmonary exam normal       Cardiovascular negative cardio ROS  Rhythm:Regular Rate:Normal     Neuro/Psych negative neurological ROS  negative psych ROS   GI/Hepatic negative GI ROS, Neg liver ROS,   Endo/Other  negative endocrine ROS  Renal/GU negative Renal ROS  negative genitourinary   Musculoskeletal negative musculoskeletal ROS (+)   Abdominal   Peds negative pediatric ROS (+)  Hematology negative hematology ROS (+)   Anesthesia Other Findings   Reproductive/Obstetrics negative OB ROS                          Anesthesia Physical Anesthesia Plan  ASA: I  Anesthesia Plan: General   Post-op Pain Management:    Induction: Intravenous  Airway Management Planned: LMA  Additional Equipment:   Intra-op Plan:   Post-operative Plan: Extubation in OR  Informed Consent: I have reviewed the patients History and Physical, chart, labs and discussed the procedure including the risks, benefits and alternatives for the proposed anesthesia with the patient or authorized representative who has indicated his/her understanding and acceptance.   Dental advisory given  Plan Discussed with: CRNA and Surgeon  Anesthesia Plan Comments:        Anesthesia Quick Evaluation

## 2012-07-27 NOTE — Anesthesia Postprocedure Evaluation (Signed)
Anesthesia Post Note  Patient: Rita Vazquez  Procedure(s) Performed: Procedure(s) (LRB): LABIAPLASTY (N/A) ANTERIOR REPAIR (CYSTOCELE) (N/A)  Anesthesia type: General  Patient location: PACU  Post pain: Pain level controlled  Post assessment: Post-op Vital signs reviewed  Last Vitals: BP 126/83  Pulse 92  Temp 36.4 C (Oral)  Resp 12  Ht 5' 5.5" (1.664 m)  Wt 120 lb (54.432 kg)  BMI 19.67 kg/m2  SpO2 99%  LMP 05/27/2012  Post vital signs: Reviewed  Level of consciousness: sedated  Complications: No apparent anesthesia complications

## 2012-07-27 NOTE — H&P (Signed)
NAME:  Rita Vazquez, [place]                ACCOUNT NO.:  [id number]  MEDICAL RECORD NO.:  [id number]  LOCATION:                               FACILITY:  [facility]  PHYSICIAN:  Lenoard Aden, M.D.DATE OF BIRTH:  1966/03/10  DATE OF ADMISSION:  07/27/2012 DATE OF DISCHARGE:                             HISTORY & PHYSICAL   CHIEF COMPLAINT:  Symptomatic cystocele, dyspareunia, perineal pain.  HISTORY OF PRESENT ILLNESS:  She is a 46 year old white female, G2, P2, Introital dyspareunia due to vaginal vulvar scarring post vaginal delivery.  ALLERGIES:  She has allergic to latex.  FAMILY HISTORY:  Melanoma, heart disease, myocardial infarction, hypertension, nonsmoker, nondrinker.  She denies domestic or physical violence.  She has a family history of melanoma, heart disease, and hypertension.  MEDICATIONS:  Adderall.  PHYSICAL EXAMINATION:  GENERAL:  She is a well-developed, well- nourished, white female, in no acute distress. VITAL SIGNS:  Height is 64 inches, weight of 110 pounds. HEENT:  Normal. NECK:  Supple.  Full range of motion. LUNGS:  Clear. HEART:  Regular rate and rhythm. ABDOMEN:  Soft, nontender. PELVIC:  Normal size uterus.  No adnexal masses.  Grade 1-2 cystocele. Evidence of vulvar scarring as noted and perineal relaxation.  IMPRESSION: 1. Symptomatic pelvic relaxation and stress urinary incontinence. 2. The patient declines mesh placement. 3. Vulvar scarring secondary to vaginal delivery.  PLAN:  Proceed with labia plasty, cystocele repair with possible Kelly plication, and perineorrhaphy.  Risks of anesthesia, infection, bleeding, injury to abdominal organs, and need for repair was discussed. Delayed versus immediate complications include possible bowel and bladder injury noted. The risks of Kelly plication is the primary incontinence procedure noted as it relates to the efficacy of a DVT.  The patient acknowledges these risks and wishes to  proceed.     Lenoard Aden, M.D.     RJT/MEDQ  D:  07/26/2012  T:  07/26/2012  Job:  [id number]

## 2012-07-27 NOTE — Progress Notes (Signed)
Patient ID: [place] D Gangemi, female   DOB: 26-Nov-1965, 46 y.o.   MRN: [id number] 07/27/2012  4:04 PM  PATIENT:  [place] D Laker  46 y.o. female  PRE-OPERATIVE DIAGNOSIS:  Labial scarring, Partial labial fusion, Stress Incontinence  POST-OPERATIVE DIAGNOSIS:  Labial scarring, Partial labial fusion, Stress Incontinence  PROCEDURE:  Procedure(s): LABIAPLASTY ANTERIOR REPAIR (CYSTOCELE)  SURGEON:  Surgeon(s): Lenoard Aden, MD  ASSISTANTS: none   ANESTHESIA:   local and general  ESTIMATED BLOOD LOSS: 50cc  DRAINS: none   LOCAL MEDICATIONS USED:  MARCAINE     SPECIMEN:  No Specimen  DISPOSITION OF SPECIMEN:  PATHOLOGY  COUNTS:  YES  DICTATION #: [id number]  PLAN OF CARE: DC home  PATIENT DISPOSITION:  PACU - hemodynamically stable.

## 2012-07-27 NOTE — Transfer of Care (Signed)
Immediate Anesthesia Transfer of Care Note  Patient: Rita Vazquez  Procedure(s) Performed: Procedure(s) (LRB): LABIAPLASTY (N/A) ANTERIOR REPAIR (CYSTOCELE) (N/A)  Patient Location: Patient transported to PACU with oxygen via face mask at 4 Liters / Min  Anesthesia Type: General  Level of Consciousness: awake and alert   Airway & Oxygen Therapy: Patient Spontanous Breathing and Patient connected to face mask oxygen  Post-op Assessment: Report given to PACU RN and Post -op Vital signs reviewed and stable  Post vital signs: Reviewed and stable  Dentition: Teeth and oropharynx remain in pre-op condition  Complications: No apparent anesthesia complications

## 2012-07-27 NOTE — Anesthesia Procedure Notes (Signed)
Procedure Name: LMA Insertion Date/Time: 07/27/2012 2:45 PM Performed by: Fran Lowes Pre-anesthesia Checklist: Patient identified, Emergency Drugs available, Suction available and Patient being monitored Patient Re-evaluated:Patient Re-evaluated prior to inductionOxygen Delivery Method: Circle System Utilized Preoxygenation: Pre-oxygenation with 100% oxygen Intubation Type: IV induction Ventilation: Mask ventilation without difficulty LMA: LMA inserted LMA Size: 4.0 Number of attempts: 1 Airway Equipment and Method: bite block Placement Confirmation: positive ETCO2 Tube secured with: Tape Dental Injury: Teeth and Oropharynx as per pre-operative assessment

## 2012-07-28 NOTE — Op Note (Signed)
NAMEElita Vazquez NO.:  [id number]  MEDICAL RECORD NO.:  [id number]  LOCATION:                                 FACILITY:  PHYSICIAN:  Lenoard Aden, M.D.     DATE OF BIRTH:  DATE OF PROCEDURE: DATE OF DISCHARGE:                              OPERATIVE REPORT   DESCRIPTION OF PROCEDURE:  After being apprised of risks of anesthesia, infection, bleeding, injury to abdominal organs, need for repair, delayed versus immediate complications include bowel and bladder injury, possible need for repair, the patient was brought to the operating room where she was administered general anesthetic without complications. She was prepped and draped in sterile fashion.  Foley catheter placed. After achieving adequate anesthesia, dilute Marcaine solution placed in the anterior vaginal mucosa.  The cystocele was identified.  A vertical incision was made.  The pubovesicocervical fascia was dissected sharply off the anterior vaginal mucosa and the cystocele was reduced.  A Kelly plication stitch was then placed using a 2-0 Monocryl suture and the cystocele was reduced and plicated using a 0 Monocryl suture.  The tissue was then trimmed and the vagina was closed using a 3-0 Vicryl repeat in an interrupted fashion.  Along the perineal area, the pelvic relaxation and perineal relaxation rectocele was noted.  The triangular wedge shaped tissue was removed from the perineum.  Rectocele was undermined.  Rectovaginal mucosa is dissected sharply off the posterior vaginal mucosa and the perirectal fascia was identified.  Imbricating sutures of 0 Monocryl suture were placed.  Excess tissue was trimmed. The rectocele was then closed and the perineorrhaphy was performed using combination of 2-0 Monocryl and 3-0 Vicryl repeat sutures in a standard fashion.  Rectal exam reveals no evidence of sutures in the rectum. Good hemostasis was noted.  Attention was then turned to the  labioplasty whereby the labia marked bilaterally where the fusion has occurred.  The skin is trimmed in a symmetrical fashion.  Hemostasis was obtained and the defect was closed using multiple interrupted figure-of-eight 3-0 Vicryl repeat sutures in an interrupted fashion.  Good hemostasis was noted.  Dilute Marcaine solution was placed.  The patient tolerated the procedure well, was awakened and transferred to recovery in good condition.     Lenoard Aden, M.D.     RJT/MEDQ  D:  07/27/2012  T:  07/28/2012  Job:  [id number]

## 2012-07-31 ENCOUNTER — Encounter (HOSPITAL_BASED_OUTPATIENT_CLINIC_OR_DEPARTMENT_OTHER): Payer: Self-pay | Admitting: Obstetrics and Gynecology

## 2012-11-07 ENCOUNTER — Other Ambulatory Visit (HOSPITAL_COMMUNITY): Payer: Self-pay | Admitting: Internal Medicine

## 2012-11-07 DIAGNOSIS — Z0000 Encounter for general adult medical examination without abnormal findings: Secondary | ICD-10-CM

## 2012-11-13 ENCOUNTER — Ambulatory Visit (HOSPITAL_COMMUNITY): Payer: BC Managed Care – PPO

## 2012-11-16 ENCOUNTER — Telehealth (HOSPITAL_COMMUNITY): Payer: Self-pay | Admitting: Dietician

## 2012-11-16 NOTE — Telephone Encounter (Signed)
Received referral via fax from [facility] (Dr. Sherwood Gambler) for dx: pt request.

## 2012-11-16 NOTE — Telephone Encounter (Signed)
Called and left message on pt voicemail at [phone].

## 2012-11-23 NOTE — Telephone Encounter (Signed)
Pt did not respond to previous contact attempt. Sent letter to pt home via [employer] Mail in attempt to contact pt to schedule appointment.  

## 2012-11-30 NOTE — Telephone Encounter (Signed)
Pt has not responded to attempts to contact to schedule appointment. Referral filed.  

## 2013-01-09 ENCOUNTER — Telehealth (HOSPITAL_COMMUNITY): Payer: Self-pay | Admitting: Dietician

## 2013-01-09 NOTE — Telephone Encounter (Signed)
Received voicemail from [place] at [facility] left at [phone]. Inquiring if pt has been seen and/or scheduled. Noted that referral has been received and filed, due to pt failing to return correspondence efforts.

## 2014-05-24 ENCOUNTER — Other Ambulatory Visit: Payer: Self-pay | Admitting: Family Medicine

## 2014-05-24 DIAGNOSIS — Z1231 Encounter for screening mammogram for malignant neoplasm of breast: Secondary | ICD-10-CM

## 2014-05-28 ENCOUNTER — Other Ambulatory Visit (HOSPITAL_COMMUNITY): Payer: Self-pay | Admitting: Family Medicine

## 2014-05-28 DIAGNOSIS — E0590 Thyrotoxicosis, unspecified without thyrotoxic crisis or storm: Secondary | ICD-10-CM

## 2014-05-31 ENCOUNTER — Ambulatory Visit (HOSPITAL_COMMUNITY)
Admission: RE | Admit: 2014-05-31 | Discharge: 2014-05-31 | Disposition: A | Discharge status: Home / Self Care | Payer: BC Managed Care – PPO | Source: Ambulatory Visit | Attending: Family Medicine | Admitting: Family Medicine

## 2014-05-31 DIAGNOSIS — E0590 Thyrotoxicosis, unspecified without thyrotoxic crisis or storm: Secondary | ICD-10-CM | POA: Diagnosis present

## 2014-06-13 ENCOUNTER — Ambulatory Visit
Admission: RE | Admit: 2014-06-13 | Discharge: 2014-06-13 | Disposition: A | Discharge status: Home / Self Care | Payer: BC Managed Care – PPO | Source: Ambulatory Visit | Attending: Family Medicine | Admitting: Family Medicine

## 2014-06-13 DIAGNOSIS — Z1231 Encounter for screening mammogram for malignant neoplasm of breast: Secondary | ICD-10-CM

## 2015-10-17 IMAGING — US US SOFT TISSUE HEAD/NECK
1 series · 13 of 25 positions shown · non-contrast
Comparison: None.

CLINICAL DATA: Hyperthyroidism. Left-sided neck soreness. Initial
encounter.

EXAM:
THYROID ULTRASOUND
TECHNIQUE: Ultrasound examination of the thyroid gland and adjacent soft
tissues was performed.

[Series 1: us soft tissue head/neck · 0.05mm/px · 83 acquisitions, 13 frames shown]
[im 1/83]
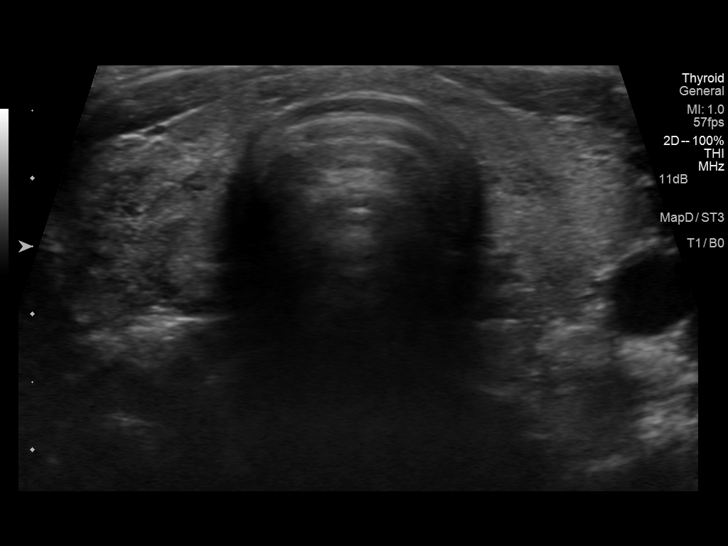
[im 7/83]
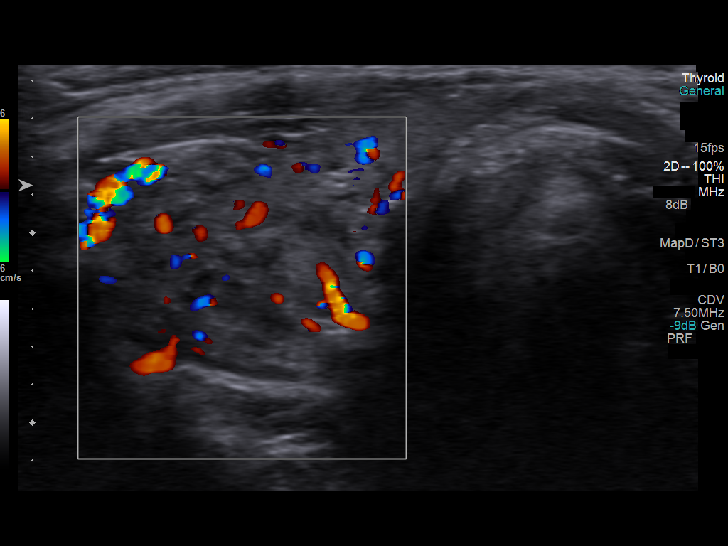
[im 14/83]
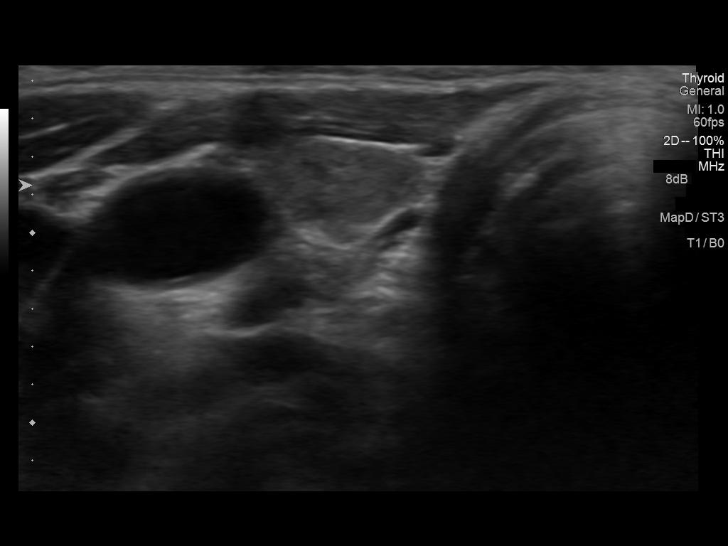
[im 21/83]
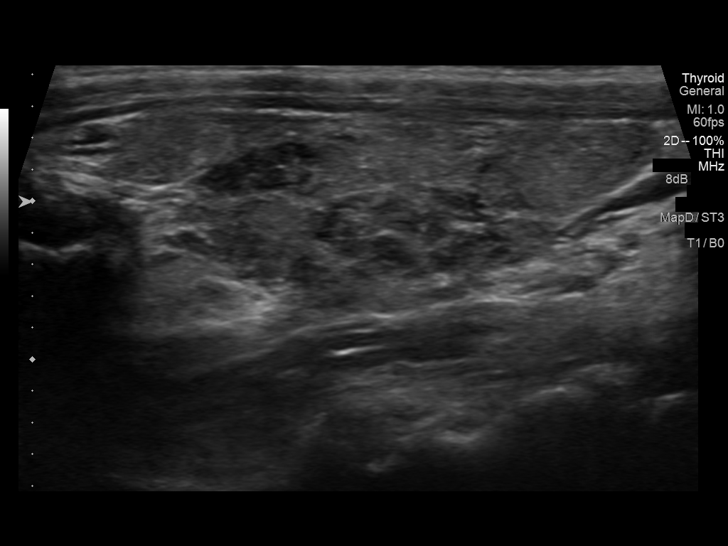
[im 28/83]
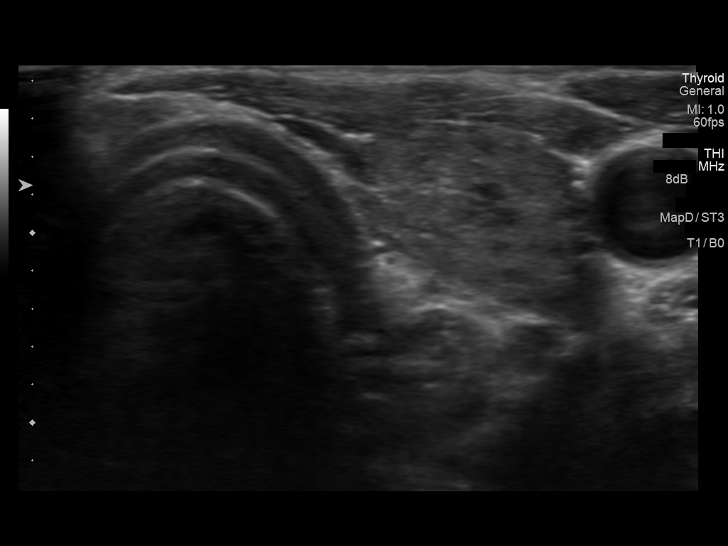
[im 35/83]
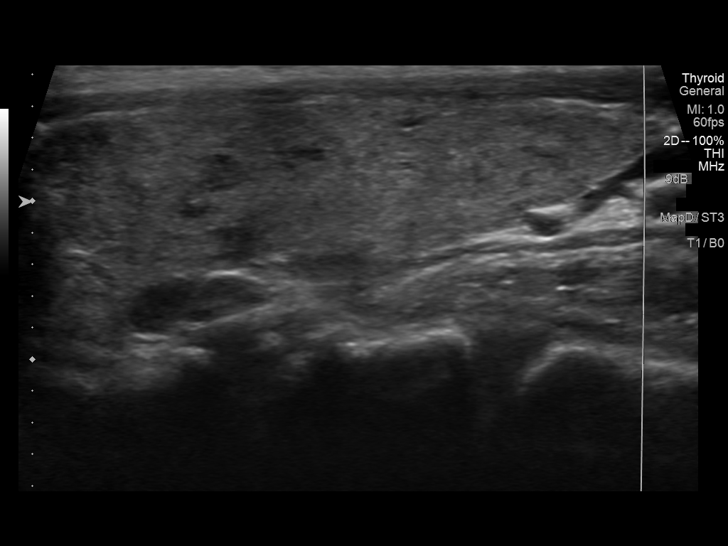
[im 42/83]
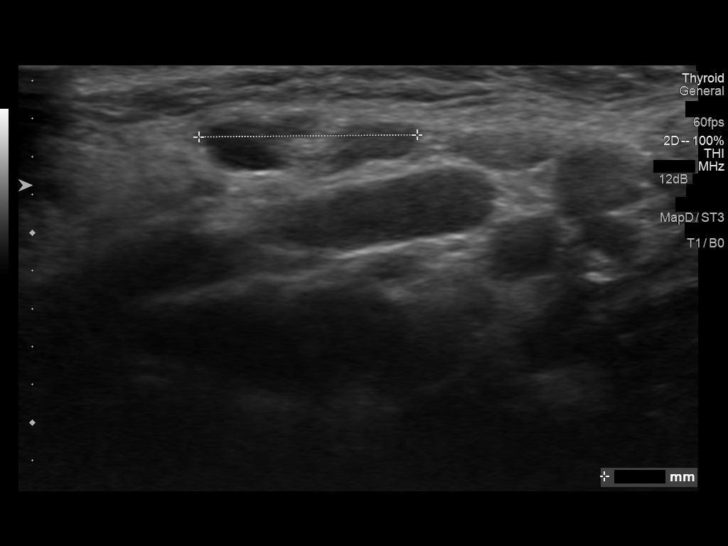
[im 48/83]
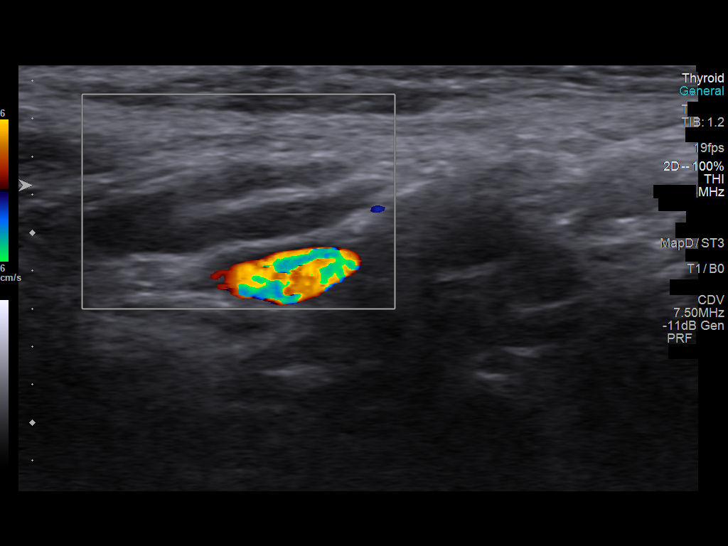
[im 55/83]
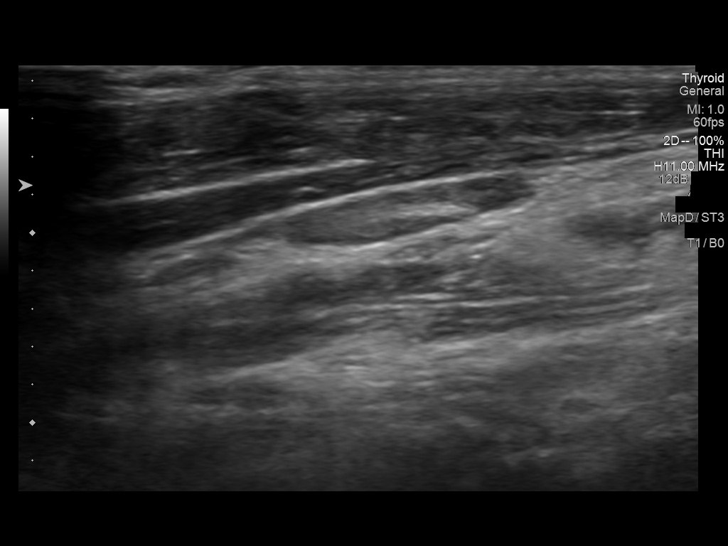
[im 62/83]
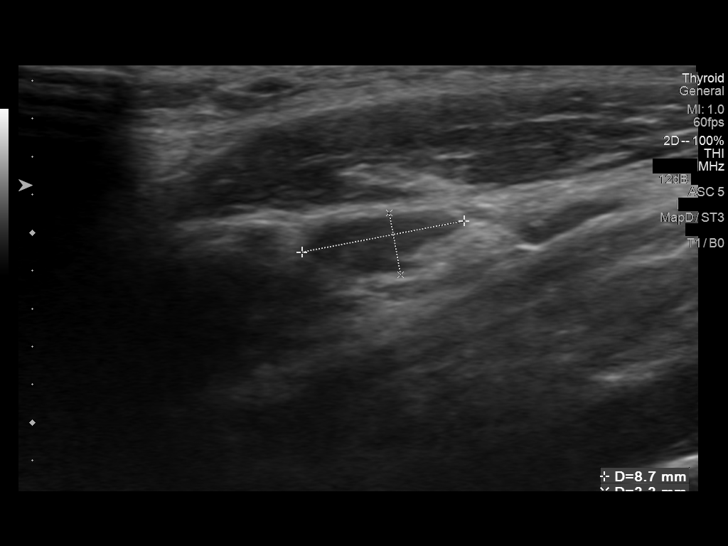
[im 69/83]
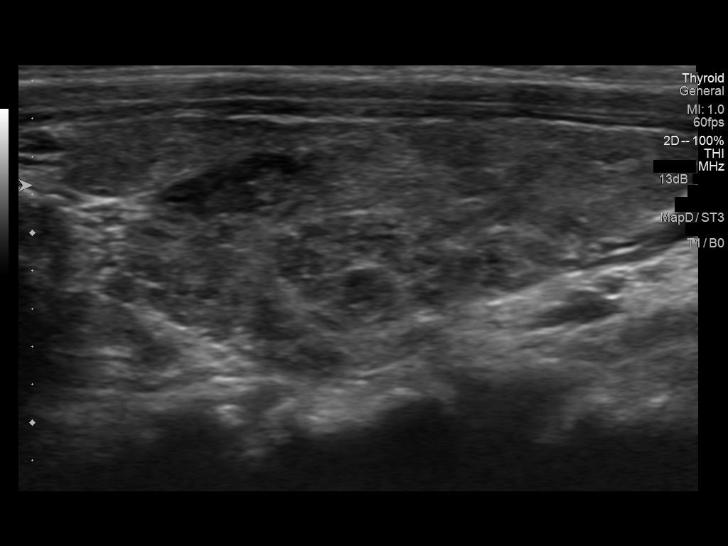
[im 76/83]
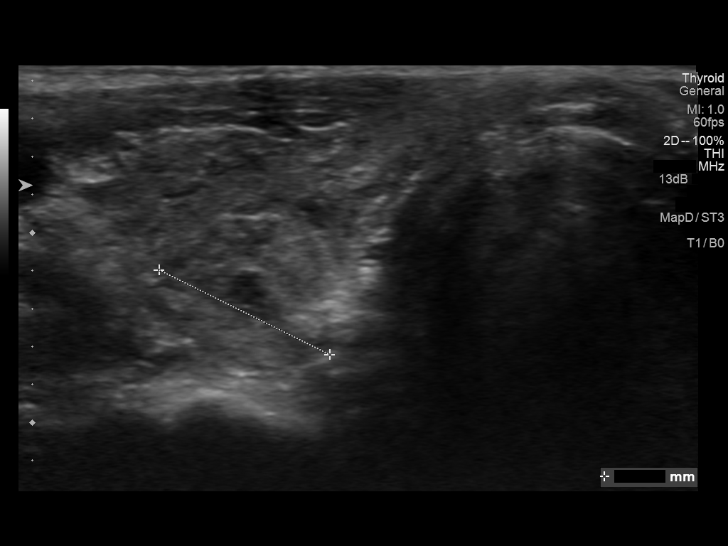
[im 83/83]
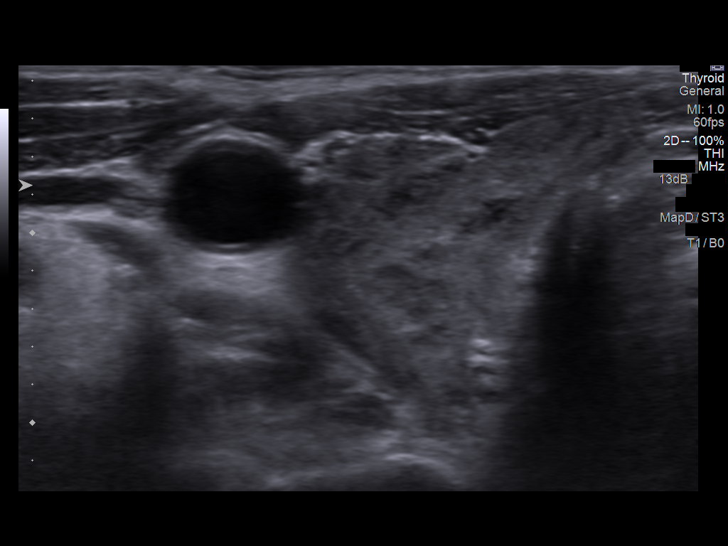

[13 of 25 positions shown; findings below may reference images not displayed]

FINDINGS: There is marked diffuse heterogeneity of the thyroid parenchymal
echotexture.

Right thyroid lobe

Measurements: Normal in size measuring 3.6 x 1.2 x 1.6 cm.

Right, mid, posterior - 1.0 x 0.9 x 1.0 cm - mixed echogenic, solid.

Right, mid - 0.9 x 0.3 x 0.6 cm - mixed echogenic, solid,
ill-defined, possibly a pseudo nodule.

Left thyroid lobe

Measurements: Normal in size measuring 3.9 x 1.0 x 1.3 cm.

There is diffuse heterogeneity of the left lobe of the thyroid
without discrete nodule.

Isthmus

Thickness: Normal in size measuring 0.2 cm in diameter..

No discrete nodules are identified within the thyroid isthmus.

Lymphadenopathy

Benign-appearing cervical lymph nodes are seen bilaterally, none of
which are enlarged by size criteria and the majority demonstrating
benign fatty hila.
IMPRESSION: 1. Nonspecific mild diffuse heterogeneity of thyroid parenchymal
echotexture. Correlation with thyroid function tests is recommended.
2. The two discrete nodules within the right lobe of the thyroid do
not meet imaging criteria to recommend percutaneous sampling. This
recommendation follows the consensus statement: Management of
Thyroid Nodules Detected at US: Society of Radiologists in
Ultrasound Consensus Conference Statement. Radiology 3441;

## 2024-07-01 ENCOUNTER — Ambulatory Visit: Admission: EM | Admit: 2024-07-01 | Discharge: 2024-07-01 | Discharge status: Against Medical Advice | Payer: Self-pay
